# Patient Record
Sex: Female | Born: 1995 | Race: White | Hispanic: No | Marital: Single | Smoking: Never smoker
Health system: Southern US, Community
[De-identification: ages and names within clinical notes are randomized; demographics above are authoritative.]

## PROBLEM LIST (undated history)

## (undated) HISTORY — PX: OTHER SURGICAL HISTORY: SHX169

---
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
## 2016-06-05 ENCOUNTER — Encounter (HOSPITAL_COMMUNITY): Payer: Self-pay | Admitting: Oncology

## 2016-06-05 ENCOUNTER — Emergency Department (HOSPITAL_COMMUNITY)
Admission: EM | Admit: 2016-06-05 | Discharge: 2016-06-06 | Disposition: A | Discharge status: Home / Self Care | Payer: Medicaid Other | Attending: Emergency Medicine | Admitting: Emergency Medicine

## 2016-06-05 DIAGNOSIS — Y9289 Other specified places as the place of occurrence of the external cause: Secondary | ICD-10-CM | POA: Insufficient documentation

## 2016-06-05 DIAGNOSIS — S025XXA Fracture of tooth (traumatic), initial encounter for closed fracture: Secondary | ICD-10-CM | POA: Diagnosis not present

## 2016-06-05 DIAGNOSIS — W500XXA Accidental hit or strike by another person, initial encounter: Secondary | ICD-10-CM | POA: Diagnosis not present

## 2016-06-05 DIAGNOSIS — Y939 Activity, unspecified: Secondary | ICD-10-CM | POA: Diagnosis not present

## 2016-06-05 DIAGNOSIS — Y999 Unspecified external cause status: Secondary | ICD-10-CM | POA: Insufficient documentation

## 2016-06-05 DIAGNOSIS — S0993XA Unspecified injury of face, initial encounter: Secondary | ICD-10-CM | POA: Diagnosis present

## 2016-06-05 MED ORDER — OXYCODONE-ACETAMINOPHEN 5-325 MG PO TABS
1.0000 | ORAL_TABLET | Freq: Once | ORAL | Status: AC
  Administered 2016-06-05: 1 via ORAL
  Filled 2016-06-05: qty 1

## 2016-06-05 MED ORDER — HYDROCODONE-ACETAMINOPHEN 5-325 MG PO TABS: 1.0000 | ORAL_TABLET | ORAL | 0 refills | Status: DC | PRN

## 2016-06-05 MED ORDER — AMOXICILLIN 500 MG PO CAPS: 500.0000 mg | ORAL_CAPSULE | Freq: Three times a day (TID) | ORAL | 0 refills | Status: DC

## 2016-06-05 MED ORDER — IBUPROFEN 800 MG PO TABS: 800.0000 mg | ORAL_TABLET | Freq: Three times a day (TID) | ORAL | 0 refills | Status: DC
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
## 2017-04-26 ENCOUNTER — Emergency Department (HOSPITAL_COMMUNITY): Payer: Medicaid Other

## 2017-04-26 ENCOUNTER — Emergency Department (HOSPITAL_COMMUNITY)
Admission: EM | Admit: 2017-04-26 | Discharge: 2017-04-26 | Disposition: A | Discharge status: Home / Self Care | Payer: Medicaid Other | Attending: Emergency Medicine | Admitting: Emergency Medicine

## 2017-04-26 ENCOUNTER — Encounter (HOSPITAL_COMMUNITY): Payer: Self-pay | Admitting: Emergency Medicine

## 2017-04-26 DIAGNOSIS — T50904A Poisoning by unspecified drugs, medicaments and biological substances, undetermined, initial encounter: Secondary | ICD-10-CM | POA: Insufficient documentation

## 2017-04-26 DIAGNOSIS — Y69 Unspecified misadventure during surgical and medical care: Secondary | ICD-10-CM | POA: Diagnosis not present

## 2017-04-26 DIAGNOSIS — T887XXA Unspecified adverse effect of drug or medicament, initial encounter: Secondary | ICD-10-CM | POA: Insufficient documentation

## 2017-04-26 LAB — RAPID URINE DRUG SCREEN, HOSP PERFORMED
Amphetamines: NOT DETECTED
BARBITURATES: NOT DETECTED
Benzodiazepines: NOT DETECTED
Cocaine: POSITIVE — AB
Opiates: POSITIVE — AB
Tetrahydrocannabinol: POSITIVE — AB

## 2017-04-26 LAB — URINALYSIS, ROUTINE W REFLEX MICROSCOPIC
BILIRUBIN URINE: NEGATIVE
GLUCOSE, UA: 150 mg/dL — AB
KETONES UR: NEGATIVE mg/dL
Leukocytes, UA: NEGATIVE
NITRITE: NEGATIVE
PH: 6 (ref 5.0–8.0)
Specific Gravity, Urine: 1.017 (ref 1.005–1.030)

## 2017-04-26 LAB — CBC WITH DIFFERENTIAL/PLATELET
BASOS PCT: 0 %
Basophils Absolute: 0 10*3/uL (ref 0.0–0.1)
Eosinophils Absolute: 0 10*3/uL (ref 0.0–0.7)
Eosinophils Relative: 0 %
HEMATOCRIT: 36.7 % (ref 36.0–46.0)
HEMOGLOBIN: 12.2 g/dL (ref 12.0–15.0)
LYMPHS ABS: 2.3 10*3/uL (ref 0.7–4.0)
Lymphocytes Relative: 10 %
MCH: 29.1 pg (ref 26.0–34.0)
MCHC: 33.2 g/dL (ref 30.0–36.0)
MCV: 87.6 fL (ref 78.0–100.0)
MONOS PCT: 6 %
Monocytes Absolute: 1.4 10*3/uL — ABNORMAL HIGH (ref 0.1–1.0)
NEUTROS ABS: 19 10*3/uL — AB (ref 1.7–7.7)
NEUTROS PCT: 84 %
Platelets: 198 10*3/uL (ref 150–400)
RBC: 4.19 MIL/uL (ref 3.87–5.11)
RDW: 14.1 % (ref 11.5–15.5)
WBC: 22.7 10*3/uL — ABNORMAL HIGH (ref 4.0–10.5)

## 2017-04-26 LAB — BASIC METABOLIC PANEL
Anion gap: 7 (ref 5–15)
BUN: 16 mg/dL (ref 6–20)
CHLORIDE: 109 mmol/L (ref 101–111)
CO2: 26 mmol/L (ref 22–32)
CREATININE: 1.03 mg/dL — AB (ref 0.44–1.00)
Calcium: 8.9 mg/dL (ref 8.9–10.3)
GFR calc non Af Amer: >60 mL/min (ref >60)
Glucose, Bld: 83 mg/dL (ref 65–99)
POTASSIUM: 4.4 mmol/L (ref 3.5–5.1)
Sodium: 142 mmol/L (ref 135–145)

## 2017-04-26 MED ORDER — ONDANSETRON HCL 4 MG/2ML IJ SOLN
4.0000 mg | Freq: Once | INTRAMUSCULAR | Status: AC
  Administered 2017-04-26: 4 mg via INTRAVENOUS

## 2017-04-26 MED ORDER — ONDANSETRON HCL 4 MG PO TABS: 4.0000 mg | ORAL_TABLET | Freq: Three times a day (TID) | ORAL | 0 refills | Status: DC | PRN

## 2017-04-26 MED ORDER — ONDANSETRON HCL 4 MG/2ML IJ SOLN
INTRAMUSCULAR | Status: AC
  Administered 2017-04-26: 4 mg via INTRAVENOUS
  Filled 2017-04-26: qty 2

## 2017-04-26 NOTE — ED Notes (Signed)
Pt vomiting.

## 2017-04-26 NOTE — ED Triage Notes (Addendum)
Pt presents to ED for overdose on percocet. Pt states she took a tablet that she got from a friend and then passed out. Pt was given 1mg  narcan by fire department. Pt reports that she took the med for fun, not SI attempt. She denies SI/HI Pt alerts and oriented x4 at this time, tearful.
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
## 2017-04-27 NOTE — ED Provider Notes (Signed)
[facility]-EMERGENCY DEPT Provider Note   CSN: [id number] Arrival date & time: 04/26/17  2018     History   Chief Complaint Chief Complaint  Patient presents with  . Drug Overdose    HPI Darlen RoundKarlee Nading is a 21 y.o. female.  HPI  After full history from patient and family saw that she took one appropriate pill and a relapse and then basically fell asleep. Father was concerned that she was then started chest compressions however she was just sleeping and woke up when he turned her over. EMS was called and she was seated them so they gave her a dose of Narcan she's been awake and crying ever since then. Slightly tachycardic initially and having nausea likely secondary to the situation. Associated modifying symptoms no other ingestions does want help and family is supportive.  History reviewed. No pertinent past medical history.  There are no active problems to display for this patient.   Past Surgical History:  Procedure Laterality Date  . cranial surgery      OB History    No data available       Home Medications    Prior to Admission medications   Medication Sig Start Date End Date Taking? Authorizing Provider  cholecalciferol (VITAMIN D) 1000 units tablet Take 1,000 Units by mouth 2 (two) times daily.   Yes [provider]  ibuprofen (ADVIL,MOTRIN) 200 MG tablet Take 200 mg by mouth every 6 (six) hours as needed for moderate pain.   Yes [provider]  TRI-PREVIFEM 0.18/0.215/0.25 MG-35 MCG tablet Take 1 tablet by mouth daily. 01/17/17  Yes [provider]  ondansetron (ZOFRAN) 4 MG tablet Take 1 tablet (4 mg total) by mouth every 8 (eight) hours as needed for nausea or vomiting. 04/26/17   Edwyn Inclan, Barbara CowerJason, MD    Family History History reviewed. No pertinent family history.  Social History Social History  Substance Use Topics  . Smoking status: Never Smoker  . Smokeless tobacco: Never Used  . Alcohol use No     Allergies   Patient has no  known allergies.   Review of Systems Review of Systems  All other systems reviewed and are negative.    Physical Exam Updated Vital Signs BP (!) 90/59 (BP Location: Left Arm)   Pulse 84   Temp 98.6 F (37 C) (Oral)   Resp 19   Ht 5\' 2"  (1.575 m)   Wt 72.6 kg (160 lb)   LMP 03/26/2017 (Exact Date)   SpO2 97%   BMI 29.26 kg/m   Physical Exam  Constitutional: She is oriented to person, place, and time. She appears well-developed and well-nourished.  HENT:  Head: Normocephalic and atraumatic.  Eyes: Conjunctivae and EOM are normal.  Neck: Normal range of motion.  Cardiovascular: Regular rhythm.  Tachycardia present.   Pulmonary/Chest: Effort normal. No stridor. Tachypnea noted. No respiratory distress. She has no wheezes.  Abdominal: Soft. She exhibits no distension.  Musculoskeletal: Normal range of motion. She exhibits no edema or deformity.  Neurological: She is alert and oriented to person, place, and time.  Skin: Skin is warm and dry. No erythema. No pallor.  Nursing note and vitals reviewed.    ED Treatments / Results  Labs (all labs ordered are listed, but only abnormal results are displayed) Labs Reviewed  CBC WITH DIFFERENTIAL/PLATELET - Abnormal; Notable for the following:       Result Value   WBC 22.7 (*)    Neutro Abs 19.0 (*)    Monocytes  Absolute 1.4 (*)    All other components within normal limits  BASIC METABOLIC PANEL - Abnormal; Notable for the following:    Creatinine, Ser 1.03 (*)    All other components within normal limits  URINALYSIS, ROUTINE W REFLEX MICROSCOPIC - Abnormal; Notable for the following:    APPearance CLOUDY (*)    Glucose, UA 150 (*)    Hgb urine dipstick SMALL (*)    Protein, ur >=300 (*)    Bacteria, UA RARE (*)    Squamous Epithelial / LPF 0-5 (*)    All other components within normal limits  RAPID URINE DRUG SCREEN, HOSP PERFORMED - Abnormal; Notable for the following:    Opiates POSITIVE (*)    Cocaine POSITIVE (*)      Tetrahydrocannabinol POSITIVE (*)    All other components within normal limits    EKG  EKG Interpretation None       Radiology Ct Maxillofacial Wo Contrast  Result Date: 04/26/2017 CLINICAL DATA:  21 year old female with facial and duration. Evaluate for fracture. EXAM: CT MAXILLOFACIAL WITHOUT CONTRAST TECHNIQUE: Multidetector CT imaging of the maxillofacial structures was performed. Multiplanar CT image reconstructions were also generated. COMPARISON:  None. FINDINGS: Osseous: No fracture or mandibular dislocation. No destructive process. Orbits: Negative. No traumatic or inflammatory finding. Sinuses: There is mild mucoperiosteal thickening of paranasal sinuses. No air-fluid levels. The mastoid air cells are clear. Soft tissues: Negative. Limited intracranial: Metallic wire along the inner table of the right frontal calvarium, likely related to prior surgery. Clinical correlation is recommended. No acute intracranial pathology involving the visualized brain. IMPRESSION: 1. No acute fracture or dislocation. 2. Mild mucoperiosteal thickening of paranasal sinuses. 3. Small metallic wire along the inner table of the right frontal calvarium likely related to prior surgery. Clinical correlation is recommended. Electronically Signed   By: Elgie CollardArash  Radparvar M.D.   On: 04/26/2017 22:30    Procedures Procedures (including critical care time)  Medications Ordered in ED Medications  ondansetron (ZOFRAN) injection 4 mg (4 mg Intravenous Given 04/26/17 2320)     Initial Impression / Assessment and Plan / ED Course  I have reviewed the triage vital signs and the nursing notes.  Pertinent labs & imaging results that were available during my care of the patient were reviewed by me and considered in my medical decision making (see chart for details).    Obvious drug abuse unclear if this was really a drug overdose per se as never apneic or lost pulses are head-on stable vital signs. However spell  that she did not take just 1 Percocet either by choice or accident. She was observed in the emergency room for a few hours a CT of her face was done to evaluate for any abnormalities secondary to some trauma to it. Patient stable for discharge with the family they will work on getting a [facility] or some other rehabilitation facility.   Final Clinical Impressions(s) / ED Diagnoses   Final diagnoses:  Drug overdose, undetermined intent, initial encounter    New Prescriptions Discharge Medication List as of 04/26/2017 11:22 PM    START taking these medications   Details  ondansetron (ZOFRAN) 4 MG tablet Take 1 tablet (4 mg total) by mouth every 8 (eight) hours as needed for nausea or vomiting., Starting Tue 04/26/2017, Print         Nahjae Hoeg, Barbara CowerJason, MD 04/29/17 1350

## 2017-05-22 ENCOUNTER — Emergency Department (HOSPITAL_COMMUNITY)
Admission: EM | Admit: 2017-05-22 | Discharge: 2017-05-23 | Disposition: A | Discharge status: Home / Self Care | Payer: Medicaid Other | Attending: Emergency Medicine | Admitting: Emergency Medicine

## 2017-05-22 ENCOUNTER — Encounter (HOSPITAL_COMMUNITY): Payer: Self-pay | Admitting: Emergency Medicine

## 2017-05-22 DIAGNOSIS — F1910 Other psychoactive substance abuse, uncomplicated: Secondary | ICD-10-CM | POA: Diagnosis not present

## 2017-05-22 DIAGNOSIS — Z79899 Other long term (current) drug therapy: Secondary | ICD-10-CM | POA: Diagnosis not present

## 2017-05-22 DIAGNOSIS — R000 Tachycardia, unspecified: Secondary | ICD-10-CM | POA: Insufficient documentation

## 2017-05-22 DIAGNOSIS — F419 Anxiety disorder, unspecified: Secondary | ICD-10-CM | POA: Diagnosis not present

## 2017-05-22 DIAGNOSIS — M545 Low back pain: Secondary | ICD-10-CM | POA: Insufficient documentation

## 2017-05-22 DIAGNOSIS — T401X1A Poisoning by heroin, accidental (unintentional), initial encounter: Secondary | ICD-10-CM | POA: Insufficient documentation

## 2017-05-22 DIAGNOSIS — R1110 Vomiting, unspecified: Secondary | ICD-10-CM | POA: Insufficient documentation

## 2017-05-22 DIAGNOSIS — R0681 Apnea, not elsewhere classified: Secondary | ICD-10-CM | POA: Diagnosis not present
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
## 2017-05-23 ENCOUNTER — Emergency Department (HOSPITAL_COMMUNITY): Payer: Medicaid Other

## 2017-05-23 LAB — URINALYSIS, ROUTINE W REFLEX MICROSCOPIC
Bilirubin Urine: NEGATIVE
GLUCOSE, UA: NEGATIVE mg/dL
HGB URINE DIPSTICK: NEGATIVE
LEUKOCYTES UA: NEGATIVE
Nitrite: NEGATIVE
PROTEIN: NEGATIVE mg/dL
Specific Gravity, Urine: 1.015 (ref 1.005–1.030)
pH: 6 (ref 5.0–8.0)

## 2017-05-23 LAB — CBC WITH DIFFERENTIAL/PLATELET
Basophils Absolute: 0 10*3/uL (ref 0.0–0.1)
Basophils Relative: 1 %
EOS ABS: 0.2 10*3/uL (ref 0.0–0.7)
EOS PCT: 3 %
HCT: 39.6 % (ref 36.0–46.0)
Hemoglobin: 13.1 g/dL (ref 12.0–15.0)
LYMPHS PCT: 36 %
Lymphs Abs: 2.2 10*3/uL (ref 0.7–4.0)
MCH: 28.4 pg (ref 26.0–34.0)
MCHC: 33.1 g/dL (ref 30.0–36.0)
MCV: 85.7 fL (ref 78.0–100.0)
MONOS PCT: 9 %
Monocytes Absolute: 0.5 10*3/uL (ref 0.1–1.0)
Neutro Abs: 3.2 10*3/uL (ref 1.7–7.7)
Neutrophils Relative %: 51 %
PLATELETS: 187 10*3/uL (ref 150–400)
RBC: 4.62 MIL/uL (ref 3.87–5.11)
RDW: 13.7 % (ref 11.5–15.5)
WBC: 6.2 10*3/uL (ref 4.0–10.5)

## 2017-05-23 LAB — COMPREHENSIVE METABOLIC PANEL
ALK PHOS: 59 U/L (ref 38–126)
ALT: 12 U/L — AB (ref 14–54)
AST: 19 U/L (ref 15–41)
Albumin: 4.4 g/dL (ref 3.5–5.0)
Anion gap: 12 (ref 5–15)
BILIRUBIN TOTAL: 0.6 mg/dL (ref 0.3–1.2)
BUN: 8 mg/dL (ref 6–20)
CHLORIDE: 107 mmol/L (ref 101–111)
CO2: 20 mmol/L — ABNORMAL LOW (ref 22–32)
CREATININE: 0.6 mg/dL (ref 0.44–1.00)
Calcium: 8.8 mg/dL — ABNORMAL LOW (ref 8.9–10.3)
GFR calc Af Amer: >60 mL/min (ref >60)
Glucose, Bld: 89 mg/dL (ref 65–99)
Potassium: 3.4 mmol/L — ABNORMAL LOW (ref 3.5–5.1)
Sodium: 139 mmol/L (ref 135–145)
Total Protein: 7.1 g/dL (ref 6.5–8.1)

## 2017-05-23 LAB — RAPID URINE DRUG SCREEN, HOSP PERFORMED
Amphetamines: POSITIVE — AB
BARBITURATES: NOT DETECTED
Benzodiazepines: POSITIVE — AB
COCAINE: POSITIVE — AB
Opiates: NOT DETECTED
TETRAHYDROCANNABINOL: POSITIVE — AB

## 2017-05-23 LAB — ETHANOL: Alcohol, Ethyl (B): 74 mg/dL — ABNORMAL HIGH (ref <5)

## 2017-05-23 LAB — I-STAT BETA HCG BLOOD, ED (MC, WL, AP ONLY)

## 2017-05-23 MED ORDER — NALOXONE HCL 4 MG/0.1ML NA LIQD: 1.0000 | Freq: Once | NASAL | 0 refills | Status: AC

## 2017-05-23 MED ORDER — SODIUM CHLORIDE 0.9 % IV BOLUS (SEPSIS)
1000.0000 mL | Freq: Once | INTRAVENOUS | Status: AC
  Administered 2017-05-23: 1000 mL via INTRAVENOUS

## 2017-05-23 NOTE — ED Notes (Signed)
Pt ambulated well in the hall and to the bathroom multiple times without assistance---- pt's gait and balance steady.

## 2017-05-23 NOTE — ED Notes (Signed)
Pt was given ginger ale for po challenge. 

## 2017-05-23 NOTE — Discharge Instructions (Signed)
1. Medications: narcan, usual home medications 2. Treatment: rest, drink plenty of fluids,  3. Follow Up: Please followup with your primary doctor in 2-3 days for discussion of your diagnoses and further evaluation after today's visit; if you do not have a primary care doctor use the resource guide provided to find one; Please return to the ER for additional overdose, fever, chills, difficulty breathing or other concerns

## 2017-05-23 NOTE — ED Provider Notes (Signed)
[facility]-EMERGENCY DEPT Provider Note   CSN: [id number] Arrival date & time: 05/22/17  2345     History   Chief Complaint Chief Complaint  Patient presents with  . Drug Overdose    HPI Gabriela Luna is a 21 y.o. female with a hx of Heroin abuse presents to the Emergency Department via EMS after becoming unconscious with apnea while with her friends. Per EMS, her friends dropped her off at house and when she returned to the car she was "acting differently" within several minutes she became apenic.  911 was contacted. The initial officer on scene initiated CPR. When the fire department arrived patient had a palpable pulse but was not breathing. They initiated bag mask ventilations and an NPA was inserted. Upon EMS arrival, patient remained apenic with assisted ventilation. She was given 0.5 mg of Narcan IN in with minimal improvement in her respiratory effort. IV was initiated and pt was given a second dose of 0.5mg  IV.  Pt awoke and vomited x2.    Pt is reporting 1mg  of Xanax, 40oz of beer and 2 "lines" of heroin tonight. She reports she does not remember anything after the 1st line.  She reports heroin abuse since the age of 89 but reports she has been "needle free" for 1.5 years.  She reports only intermittent use of Xanax.  Pt reports pain in her back at this time without other complaints. She requests that her parents not be contacted at this time.     The history is provided by the patient, medical records, the EMS personnel and the police. No language interpreter was used.    History reviewed. No pertinent past medical history.  There are no active problems to display for this patient.   Past Surgical History:  Procedure Laterality Date  . cranial surgery      OB History    No data available       Home Medications    Prior to Admission medications   Medication Sig Start Date End Date Taking? Authorizing Provider  cholecalciferol (VITAMIN D) 1000 units tablet Take 1,000  Units by mouth 2 (two) times daily.   Yes [provider]  ibuprofen (ADVIL,MOTRIN) 200 MG tablet Take 200 mg by mouth every 6 (six) hours as needed for moderate pain.   Yes [provider]  TRI-PREVIFEM 0.18/0.215/0.25 MG-35 MCG tablet Take 1 tablet by mouth daily. 01/17/17  Yes [provider]  naloxone (NARCAN) nasal spray 4 mg/0.1 mL Place 1 spray into the nose once. PRN opioid overdose 05/23/17 05/23/17  Achsah Mcquade, Dahlia Client, PA-C    Family History History reviewed. No pertinent family history.  Social History Social History  Substance Use Topics  . Smoking status: Never Smoker  . Smokeless tobacco: Never Used  . Alcohol use No     Allergies   Patient has no known allergies.   Review of Systems Review of Systems  Constitutional: Negative for appetite change, diaphoresis, fatigue, fever and unexpected weight change.  HENT: Negative for mouth sores.   Eyes: Negative for visual disturbance.  Respiratory: Positive for apnea. Negative for cough, chest tightness, shortness of breath and wheezing.   Cardiovascular: Negative for chest pain.  Gastrointestinal: Positive for vomiting ( x1 after narcan administration). Negative for abdominal pain, constipation, diarrhea and nausea.  Endocrine: Negative for polydipsia, polyphagia and polyuria.  Genitourinary: Negative for dysuria, frequency, hematuria and urgency.  Musculoskeletal: Positive for back pain. Negative for neck stiffness.  Skin: Positive for wound (abrasion). Negative for rash.  Allergic/Immunologic: Negative for immunocompromised state.  Neurological: Negative for syncope, light-headedness and headaches.  Hematological: Does not bruise/bleed easily.  Psychiatric/Behavioral: Negative for sleep disturbance. The patient is not nervous/anxious.   All other systems reviewed and are negative.    Physical Exam Updated Vital Signs BP 137/80 (BP Location: Left Arm)   Pulse (!) 108   Temp 98.6 F (37  C) (Oral)   Resp 17   SpO2 99%   Physical Exam  Constitutional: She appears well-developed and well-nourished. No distress.  Awake, alert, nontoxic appearance  HENT:  Head: Normocephalic and atraumatic.  Mouth/Throat: Oropharynx is clear and moist. No oropharyngeal exudate.  Eyes: Conjunctivae are normal. No scleral icterus.  Neck: Normal range of motion. Neck supple.  Cardiovascular: Regular rhythm and intact distal pulses.  Tachycardia present.   Pulses:      Radial pulses are 2+ on the right side, and 2+ on the left side.  Pulmonary/Chest: Effort normal and breath sounds normal. No respiratory distress. She has no wheezes.  Equal chest expansion No bruising or deformity of the chest  Abdominal: Soft. Bowel sounds are normal. She exhibits no mass. There is no tenderness. There is no rebound and no guarding.  Musculoskeletal: Normal range of motion. She exhibits no edema.  FROM of the T-spine and L-spine Large abrasion over the T-spine.  Neurological: She is alert.  Speech is clear and goal oriented Moves extremities without ataxia  Skin: Skin is warm and dry. She is not diaphoretic.  Psychiatric: Her mood appears anxious.  Pt crying  Nursing note and vitals reviewed.    ED Treatments / Results  Labs (all labs ordered are listed, but only abnormal results are displayed) Labs Reviewed  COMPREHENSIVE METABOLIC PANEL - Abnormal; Notable for the following:       Result Value   Potassium 3.4 (*)    CO2 20 (*)    Calcium 8.8 (*)    ALT 12 (*)    All other components within normal limits  ETHANOL - Abnormal; Notable for the following:    Alcohol, Ethyl (B) 74 (*)    All other components within normal limits  RAPID URINE DRUG SCREEN, HOSP PERFORMED - Abnormal; Notable for the following:    Cocaine POSITIVE (*)    Benzodiazepines POSITIVE (*)    Amphetamines POSITIVE (*)    Tetrahydrocannabinol POSITIVE (*)    All other components within normal limits  URINALYSIS, ROUTINE  W REFLEX MICROSCOPIC - Abnormal; Notable for the following:    Ketones, ur TRACE (*)    All other components within normal limits  CBC WITH DIFFERENTIAL/PLATELET  I-STAT BETA HCG BLOOD, ED ([facility], [facility], [facility] ONLY)    EKG  EKG Interpretation  Date/Time:  Sunday May 22 2017 23:56:49 EDT Ventricular Rate:  113 PR Interval:    QRS Duration: 99 QT Interval:  359 QTC Calculation: 493 R Axis:   73 Text Interpretation:  Sinus tachycardia Borderline Q waves in lateral leads Borderline prolonged QT interval No significant change was found Confirmed by Azalia Bilis ([id number]) on 05/23/2017 1:09:11 AM       Radiology Dg Chest 2 View  Result Date: 05/23/2017 CLINICAL DATA:  Unresponsiveness due to drug overdose EXAM: CHEST  2 VIEW COMPARISON:  05/18/2012 FINDINGS: The heart size and mediastinal contours are within normal limits. Both lungs are clear. The visualized skeletal structures are unremarkable. IMPRESSION: No active cardiopulmonary disease. Electronically Signed   By: Jasmine Pang M.D.   On: 05/23/2017 01:16   Dg  Thoracic Spine 2 View  Result Date: 05/23/2017 CLINICAL DATA:  Abrasions to the spine after CPR EXAM: THORACIC SPINE 2 VIEWS COMPARISON:  None. FINDINGS: There is no evidence of thoracic spine fracture. Alignment is normal. No other significant bone abnormalities are identified. IMPRESSION: Negative. Electronically Signed   By: Jasmine Pang M.D.   On: 05/23/2017 01:17    Procedures Procedures (including critical care time)  Medications Ordered in ED Medications  sodium chloride 0.9 % bolus 1,000 mL (0 mLs Intravenous Stopped 05/23/17 0237)     Initial Impression / Assessment and Plan / ED Course  I have reviewed the triage vital signs and the nursing notes.  Pertinent labs & imaging results that were available during my care of the patient were reviewed by me and considered in my medical decision making (see chart for details).     Patient presents after opioid overdose.  Her urine drug screen is positive for cocaine, benzodiazepine, amphetamine and THC. It is negative for opiates however her apnea was reversed after Narcan administration, indicating opioid ingestion. Labs are otherwise reassuring. Patient has had a for our observation here in the emergency department. She has not required repeat doses of Narcan. She is tolerating by mouth and ambulated on her own. She has remained alert and oriented since her arrival. Chest x-ray without evidence of sternal or rib fracture. No current evidence of aspiration. Large abrasion on her back unknown if this was a fall or secondary to the 30 compressions of CPR that she received. No evidence of fracture or T-spine. She has no numbness or weakness, loss of bowel or bladder control.  Patient wishes for discharge home. She will be given outpatient resources for drug rehabilitation. Patient also given prescription for Narcan. Discussed reasons to return immediately to the emergency department. Patient states understanding and is in agreement with the plan.  Final Clinical Impressions(s) / ED Diagnoses   Final diagnoses:  Accidental overdose of heroin, initial encounter  Polysubstance abuse    New Prescriptions New Prescriptions   NALOXONE (NARCAN) NASAL SPRAY 4 MG/0.1 ML    Place 1 spray into the nose once. PRN opioid overdose     Milta Deiters 05/23/17 0408    Azalia Bilis, MD 05/23/17 [phone]

## 2017-08-30 ENCOUNTER — Emergency Department (HOSPITAL_COMMUNITY)
Admission: EM | Admit: 2017-08-30 | Discharge: 2017-08-30 | Disposition: A | Discharge status: Home / Self Care | Payer: Medicaid Other | Attending: Emergency Medicine | Admitting: Emergency Medicine

## 2017-08-30 ENCOUNTER — Emergency Department (HOSPITAL_COMMUNITY): Payer: Medicaid Other

## 2017-08-30 DIAGNOSIS — T401X1A Poisoning by heroin, accidental (unintentional), initial encounter: Secondary | ICD-10-CM | POA: Insufficient documentation

## 2017-08-30 DIAGNOSIS — N3001 Acute cystitis with hematuria: Secondary | ICD-10-CM

## 2017-08-30 DIAGNOSIS — T887XXA Unspecified adverse effect of drug or medicament, initial encounter: Secondary | ICD-10-CM | POA: Insufficient documentation

## 2017-08-30 DIAGNOSIS — Y69 Unspecified misadventure during surgical and medical care: Secondary | ICD-10-CM | POA: Diagnosis not present

## 2017-08-30 DIAGNOSIS — T50901A Poisoning by unspecified drugs, medicaments and biological substances, accidental (unintentional), initial encounter: Secondary | ICD-10-CM

## 2017-08-30 LAB — CBC WITH DIFFERENTIAL/PLATELET
BASOS ABS: 0 10*3/uL (ref 0.0–0.1)
BASOS PCT: 0 %
Eosinophils Absolute: 0 10*3/uL (ref 0.0–0.7)
Eosinophils Relative: 0 %
HEMATOCRIT: 45.3 % (ref 36.0–46.0)
Hemoglobin: 14.6 g/dL (ref 12.0–15.0)
Lymphocytes Relative: 12 %
Lymphs Abs: 2.5 10*3/uL (ref 0.7–4.0)
MCH: 29.9 pg (ref 26.0–34.0)
MCHC: 32.2 g/dL (ref 30.0–36.0)
MCV: 92.6 fL (ref 78.0–100.0)
MONO ABS: 0.4 10*3/uL (ref 0.1–1.0)
Monocytes Relative: 2 %
NEUTROS ABS: 17.5 10*3/uL — AB (ref 1.7–7.7)
NEUTROS PCT: 86 %
Platelets: 211 10*3/uL (ref 150–400)
RBC: 4.89 MIL/uL (ref 3.87–5.11)
RDW: 14.3 % (ref 11.5–15.5)
WBC: 20.4 10*3/uL — ABNORMAL HIGH (ref 4.0–10.5)

## 2017-08-30 LAB — URINALYSIS, ROUTINE W REFLEX MICROSCOPIC
Bilirubin Urine: NEGATIVE
Glucose, UA: NEGATIVE mg/dL
Ketones, ur: 5 mg/dL — AB
Nitrite: NEGATIVE
Protein, ur: 30 mg/dL — AB
SPECIFIC GRAVITY, URINE: 1.013 (ref 1.005–1.030)
pH: 5 (ref 5.0–8.0)

## 2017-08-30 LAB — COMPREHENSIVE METABOLIC PANEL
ALT: 99 U/L — AB (ref 14–54)
AST: 92 U/L — ABNORMAL HIGH (ref 15–41)
Albumin: 4.3 g/dL (ref 3.5–5.0)
Alkaline Phosphatase: 86 U/L (ref 38–126)
Anion gap: 19 — ABNORMAL HIGH (ref 5–15)
BUN: 14 mg/dL (ref 6–20)
CALCIUM: 9.1 mg/dL (ref 8.9–10.3)
CHLORIDE: 100 mmol/L — AB (ref 101–111)
CO2: 18 mmol/L — AB (ref 22–32)
Creatinine, Ser: 1.28 mg/dL — ABNORMAL HIGH (ref 0.44–1.00)
GFR, EST NON AFRICAN AMERICAN: 60 mL/min — AB (ref >60)
Glucose, Bld: 127 mg/dL — ABNORMAL HIGH (ref 65–99)
POTASSIUM: 4.1 mmol/L (ref 3.5–5.1)
SODIUM: 137 mmol/L (ref 135–145)
TOTAL PROTEIN: 7.5 g/dL (ref 6.5–8.1)
Total Bilirubin: 0.5 mg/dL (ref 0.3–1.2)

## 2017-08-30 LAB — I-STAT BETA HCG BLOOD, ED (MC, WL, AP ONLY): I-stat hCG, quantitative: <5 m[IU]/mL (ref <5)

## 2017-08-30 LAB — RAPID URINE DRUG SCREEN, HOSP PERFORMED
Amphetamines: POSITIVE — AB
BARBITURATES: NOT DETECTED
Benzodiazepines: NOT DETECTED
Cocaine: POSITIVE — AB
Opiates: POSITIVE — AB
TETRAHYDROCANNABINOL: POSITIVE — AB

## 2017-08-30 LAB — ACETAMINOPHEN LEVEL: Acetaminophen (Tylenol), Serum: <10 ug/mL — ABNORMAL LOW (ref 10–30)

## 2017-08-30 LAB — ETHANOL: Alcohol, Ethyl (B): <10 mg/dL (ref <10)

## 2017-08-30 LAB — SALICYLATE LEVEL

## 2017-08-30 MED ORDER — SULFAMETHOXAZOLE-TRIMETHOPRIM 800-160 MG PO TABS
1.0000 | ORAL_TABLET | Freq: Once | ORAL | Status: AC
  Administered 2017-08-30: 1 via ORAL
  Filled 2017-08-30: qty 1

## 2017-08-30 MED ORDER — NALOXONE HCL 4 MG/0.1ML NA LIQD
1.0000 | Freq: Once | NASAL | Status: AC
  Administered 2017-08-30: 1 via NASAL
  Filled 2017-08-30: qty 4

## 2017-08-30 MED ORDER — SODIUM CHLORIDE 0.9 % IV BOLUS (SEPSIS)
1000.0000 mL | Freq: Once | INTRAVENOUS | Status: AC
  Administered 2017-08-30: 1000 mL via INTRAVENOUS

## 2017-08-30 MED ORDER — SODIUM CHLORIDE 0.9 % IV SOLN
INTRAVENOUS | Status: DC
  Administered 2017-08-30: 125 mL via INTRAVENOUS

## 2017-08-30 MED ORDER — SULFAMETHOXAZOLE-TRIMETHOPRIM 800-160 MG PO TABS: 1.0000 | ORAL_TABLET | Freq: Two times a day (BID) | ORAL | 0 refills | Status: AC

## 2017-08-30 NOTE — ED Notes (Signed)
ED Provider at bedside. 

## 2017-08-30 NOTE — ED Notes (Signed)
Bed: [facility] Expected date:  Expected time:  Means of arrival:  Comments: 

## 2017-08-30 NOTE — ED Provider Notes (Signed)
[facility]-EMERGENCY DEPT Provider Note   CSN: [id number] Arrival date & time: 08/30/17  0749     History   Chief Complaint Chief Complaint  Patient presents with  . Drug Overdose    HPI Gabriela Luna is a 21 y.o. female.  Pt presents to the ED with heroin overdose.  Pt has a hx of polysubstance abuse with multiple heroin overdoses.  The last overdose was in August.  The pt was at her parent's house.  Her dad called 911 and said pt was unresponsive.  Pt was breathing only 4 or 5 times a minutes when EMS arrived. The pt was given 1 mg of narcan IN by the fire department and she aroused.  The pt denies any pain now.      No past medical history on file.  There are no active problems to display for this patient.   Past Surgical History:  Procedure Laterality Date  . cranial surgery      OB History    No data available       Home Medications    Prior to Admission medications   Medication Sig Start Date End Date Taking? Authorizing Provider  ibuprofen (ADVIL,MOTRIN) 200 MG tablet Take 200 mg by mouth every 6 (six) hours as needed for moderate pain.   Yes [provider]  sulfamethoxazole-trimethoprim (BACTRIM DS,SEPTRA DS) 800-160 MG tablet Take 1 tablet by mouth 2 (two) times daily for 7 days. 08/30/17 09/06/17  Jacalyn LefevreHaviland, Aeryn Medici, MD    Family History No family history on file.  Social History Social History   Tobacco Use  . Smoking status: Never Smoker  . Smokeless tobacco: Never Used  Substance Use Topics  . Alcohol use: No  . Drug use: Yes     Allergies   Patient has no known allergies.   Review of Systems Review of Systems  All other systems reviewed and are negative.    Physical Exam Updated Vital Signs BP 101/61 (BP Location: Right Arm)   Pulse 82   Temp 98 F (36.7 C) (Oral)   Resp 17   LMP 07/31/2017 (Approximate)   SpO2 94%   Physical Exam  Constitutional: She appears well-developed and well-nourished.    HENT:  Head: Normocephalic and atraumatic.  Right Ear: External ear normal.  Left Ear: External ear normal.  Nose: Nose normal.  Mouth/Throat: Oropharynx is clear and moist.  Eyes: Conjunctivae and EOM are normal. Pupils are equal, round, and reactive to light.  Neck: Normal range of motion. Neck supple.  Cardiovascular: Regular rhythm, normal heart sounds and intact distal pulses. Tachycardia present.  Pulmonary/Chest: Effort normal and breath sounds normal.  Abdominal: Soft. Bowel sounds are normal.  Musculoskeletal: Normal range of motion.  Neurological:  Pt is sleepy, but will awaken to verbal stimuli and answer questions.  She is moving all 4 extremities.  Skin: Skin is warm. Capillary refill takes less than 2 seconds.  Nursing note and vitals reviewed.    ED Treatments / Results  Labs (all labs ordered are listed, but only abnormal results are displayed) Labs Reviewed  COMPREHENSIVE METABOLIC PANEL - Abnormal; Notable for the following components:      Result Value   Chloride 100 (*)    CO2 18 (*)    Glucose, Bld 127 (*)    Creatinine, Ser 1.28 (*)    AST 92 (*)    ALT 99 (*)    GFR calc non Af Amer 60 (*)    Anion  gap 19 (*)    All other components within normal limits  ACETAMINOPHEN LEVEL - Abnormal; Notable for the following components:   Acetaminophen (Tylenol), Serum <10 (*)    All other components within normal limits  RAPID URINE DRUG SCREEN, HOSP PERFORMED - Abnormal; Notable for the following components:   Opiates POSITIVE (*)    Cocaine POSITIVE (*)    Amphetamines POSITIVE (*)    Tetrahydrocannabinol POSITIVE (*)    All other components within normal limits  CBC WITH DIFFERENTIAL/PLATELET - Abnormal; Notable for the following components:   WBC 20.4 (*)    Neutro Abs 17.5 (*)    All other components within normal limits  URINALYSIS, ROUTINE W REFLEX MICROSCOPIC - Abnormal; Notable for the following components:   APPearance CLOUDY (*)    Hgb urine  dipstick SMALL (*)    Ketones, ur 5 (*)    Protein, ur 30 (*)    Leukocytes, UA LARGE (*)    Bacteria, UA MANY (*)    Squamous Epithelial / LPF 6-30 (*)    All other components within normal limits  SALICYLATE LEVEL  ETHANOL  I-STAT BETA HCG BLOOD, ED ([facility], [facility], [facility] ONLY)    EKG  EKG Interpretation  Date/Time:  Tuesday August 30 2017 08:44:22 EST Ventricular Rate:  106 PR Interval:  158 QRS Duration: 86 QT Interval:  348 QTC Calculation: 462 R Axis:   79 Text Interpretation:  Sinus tachycardia Otherwise normal ECG Confirmed by Jacalyn Lefevre [phone]) on 08/30/2017 8:56:53 AM Also confirmed by Jacalyn Lefevre [phone]), editor Barbette Hair [phone])  on 08/30/2017 9:27:32 AM       Radiology Dg Chest 1 View  Result Date: 08/30/2017 CLINICAL DATA:  Mental status change, possible heroin overdose EXAM: CHEST 1 VIEW COMPARISON:  Chest x-ray of May 23, 2017 FINDINGS: The lungs are adequately inflated and clear. The heart and pulmonary vascularity are normal. The mediastinum is normal in width. There is no pleural effusion. The bony thorax is unremarkable. IMPRESSION: There is no active cardiopulmonary disease. Electronically Signed   By: David  [place] M.D.   On: 08/30/2017 08:53    Procedures Procedures (including critical care time)  Medications Ordered in ED Medications  sodium chloride 0.9 % bolus 1,000 mL (0 mLs Intravenous Stopped 08/30/17 0843)    And  0.9 %  sodium chloride infusion ( Intravenous Stopped 08/30/17 1314)  naloxone (NARCAN) nasal spray 4 mg/0.1 mL (not administered)  sulfamethoxazole-trimethoprim (BACTRIM DS,SEPTRA DS) 800-160 MG per tablet 1 tablet (1 tablet Oral Given 08/30/17 1232)     Initial Impression / Assessment and Plan / ED Course  I have reviewed the triage vital signs and the nursing notes.  Pertinent labs & imaging results that were available during my care of the patient were reviewed by me and considered in my medical decision making (see chart  for details).  Pt has been observed for 5.5 hours.  She is more alert and oxygenating well.  She is stable for d/c.   Pt does not want treatment for her heroin abuse.   She is given a home narcan IN dose.  She is given information for outpatient treatment if she changes her mind.  The pt knows to return if worse.  Final Clinical Impressions(s) / ED Diagnoses   Final diagnoses:  Accidental overdose of heroin, initial encounter [facility])  Acute cystitis with hematuria    ED Discharge Orders        Ordered    sulfamethoxazole-trimethoprim (BACTRIM DS,SEPTRA DS)  800-160 MG tablet  2 times daily     08/30/17 1312       Jacalyn LefevreHaviland, Jakorey Mcconathy, MD 08/30/17 1319

## 2017-08-30 NOTE — ED Triage Notes (Signed)
Transported by [facility] from home, pt's dad called and reported that patient was unresponsive. Fire department administered 1 mg of Narcan IN,after arousal patient reported that she snorted heroin.
